# Patient Record
Sex: Female | Born: 1990 | Race: Black or African American | Hispanic: No | Marital: Single
Health system: Southern US, Community
[De-identification: ages and names within clinical notes are randomized; demographics above are authoritative.]

---
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
## 2012-06-13 ENCOUNTER — Emergency Department (HOSPITAL_COMMUNITY)
Admission: EM | Admit: 2012-06-13 | Discharge: 2012-06-13 | Disposition: A | Discharge status: Home / Self Care | Payer: Self-pay | Attending: Emergency Medicine | Admitting: Emergency Medicine

## 2012-06-13 ENCOUNTER — Encounter (HOSPITAL_COMMUNITY): Payer: Self-pay | Admitting: Emergency Medicine

## 2012-06-13 DIAGNOSIS — R109 Unspecified abdominal pain: Secondary | ICD-10-CM | POA: Insufficient documentation

## 2012-06-13 DIAGNOSIS — R1110 Vomiting, unspecified: Secondary | ICD-10-CM | POA: Insufficient documentation

## 2012-06-13 DIAGNOSIS — F17200 Nicotine dependence, unspecified, uncomplicated: Secondary | ICD-10-CM | POA: Insufficient documentation

## 2012-06-13 LAB — URINALYSIS, ROUTINE W REFLEX MICROSCOPIC
Leukocytes, UA: NEGATIVE
Protein, ur: NEGATIVE mg/dL
Urobilinogen, UA: 1 mg/dL (ref 0.0–1.0)

## 2012-06-13 LAB — CBC WITH DIFFERENTIAL/PLATELET
Basophils Absolute: 0 10*3/uL (ref 0.0–0.1)
Basophils Relative: 0 % (ref 0–1)
Eosinophils Absolute: 0.1 10*3/uL (ref 0.0–0.7)
Eosinophils Relative: 2 % (ref 0–5)
Lymphocytes Relative: 50 % — ABNORMAL HIGH (ref 12–46)
MCH: 31.7 pg (ref 26.0–34.0)
MCV: 95.4 fL (ref 78.0–100.0)
Platelets: 368 10*3/uL (ref 150–400)
RDW: 12.7 % (ref 11.5–15.5)
WBC: 7.1 10*3/uL (ref 4.0–10.5)

## 2012-06-13 LAB — POCT PREGNANCY, URINE: Preg Test, Ur: NEGATIVE

## 2012-06-13 LAB — BASIC METABOLIC PANEL
Calcium: 9.8 mg/dL (ref 8.4–10.5)
GFR calc non Af Amer: >90 mL/min (ref >90)
Sodium: 138 mEq/L (ref 135–145)

## 2012-06-13 MED ORDER — RANITIDINE HCL 150 MG PO CAPS: 150.0000 mg | ORAL_CAPSULE | Freq: Every day | ORAL | Status: DC

## 2012-06-13 MED ORDER — ONDANSETRON 4 MG PO TBDP
4.0000 mg | ORAL_TABLET | Freq: Once | ORAL | Status: AC
  Administered 2012-06-13: 4 mg via ORAL
  Filled 2012-06-13: qty 1

## 2012-06-13 MED ORDER — ONDANSETRON 4 MG PO TBDP: ORAL_TABLET | ORAL | Status: DC

## 2012-06-13 MED ORDER — OXYCODONE-ACETAMINOPHEN 5-325 MG PO TABS
1.0000 | ORAL_TABLET | Freq: Once | ORAL | Status: AC
  Administered 2012-06-13: 1 via ORAL
  Filled 2012-06-13: qty 1

## 2012-06-13 NOTE — ED Notes (Signed)
Pt c/o abd pain and vomiting intermittently x 1 month; pt sts some burning with urination but denies vaginal discharge; pt sts LMP was 8/15

## 2012-06-13 NOTE — ED Provider Notes (Signed)
History     CSN: [id number]  Arrival date & time 06/13/12  8657   First MD Initiated Contact with Patient 06/13/12 1158      Chief Complaint  Patient presents with  . Abdominal Pain  . Emesis    (Consider location/radiation/quality/duration/timing/severity/associated sxs/prior treatment) Patient is a 21 y.o. female presenting with abdominal pain. The history is provided by the patient.  Abdominal Pain The primary symptoms of the illness include abdominal pain, vomiting and diarrhea (frequent loos stools). The primary symptoms of the illness do not include fever, fatigue, shortness of breath, nausea, dysuria, vaginal discharge or vaginal bleeding. Episode onset: 1 mos. The onset of the illness was gradual. The problem has not changed since onset. The abdominal pain began more than 2 days ago. The pain came on gradually. Progression: intermittent. Pain Location: lower abdomen. The abdominal pain does not radiate. The severity of the abdominal pain is 1/10. Relieved by: motrin. Exacerbated by: nothing.  Symptoms associated with the illness do not include constipation, urgency, hematuria, frequency or back pain.    History reviewed. No pertinent past medical history.  History reviewed. No pertinent past surgical history.  History reviewed. No pertinent family history.  History  Substance Use Topics  . Smoking status: Current Every Day Smoker  . Smokeless tobacco: Not on file  . Alcohol Use: Yes     occ    OB History    Grav Para Term Preterm Abortions TAB SAB Ect Mult Living                  Review of Systems  Constitutional: Negative for fever and fatigue.  HENT: Negative for congestion, drooling and neck pain.   Eyes: Negative for pain.  Respiratory: Negative for cough and shortness of breath.   Cardiovascular: Negative for chest pain.  Gastrointestinal: Positive for vomiting, abdominal pain and diarrhea (frequent loos stools). Negative for nausea and constipation.    Genitourinary: Negative for dysuria, urgency, frequency, hematuria, flank pain, decreased urine volume, vaginal bleeding, vaginal discharge, difficulty urinating, genital sores, menstrual problem and pelvic pain.  Musculoskeletal: Negative for back pain and gait problem.  Skin: Negative for color change.  Neurological: Negative for dizziness and headaches.  Hematological: Negative for adenopathy.  Psychiatric/Behavioral: Negative for behavioral problems.  All other systems reviewed and are negative.    Allergies  Review of patient's allergies indicates no known allergies.  Home Medications   Current Outpatient Rx  Name Route Sig Dispense Refill  . IBUPROFEN 400 MG PO TABS Oral Take 800 mg by mouth every 6 (six) hours as needed.      Marland Kitchen ONDANSETRON 4 MG PO TBDP  4mg  ODT q4 hours prn nausea/vomit 10 tablet 0  . RANITIDINE HCL 150 MG PO CAPS Oral Take 1 capsule (150 mg total) by mouth daily. 30 capsule 0    BP 124/59  Pulse 68  Temp 98.2 F (36.8 C) (Oral)  Resp 18  SpO2 100%  Physical Exam  Nursing note and vitals reviewed. Constitutional: She is oriented to person, place, and time. She appears well-developed and well-nourished.  HENT:  Head: Normocephalic.  Mouth/Throat: No oropharyngeal exudate.  Eyes: Conjunctivae normal and EOM are normal. Pupils are equal, round, and reactive to light.  Neck: Normal range of motion. Neck supple.  Cardiovascular: Normal rate, regular rhythm, normal heart sounds and intact distal pulses.  Exam reveals no gallop and no friction rub.   No murmur heard. Pulmonary/Chest: Effort normal and breath sounds normal. No respiratory  distress. She has no wheezes.  Abdominal: Soft. Bowel sounds are normal. There is tenderness (mild suprapubic pain on exam). There is no rebound and no guarding.  Musculoskeletal: Normal range of motion. She exhibits no edema and no tenderness.  Neurological: She is alert and oriented to person, place, and time.  Skin:  Skin is warm and dry.  Psychiatric: She has a normal mood and affect. Her behavior is normal.    ED Course  Procedures (including critical care time)  Labs Reviewed  CBC WITH DIFFERENTIAL - Abnormal; Notable for the following:    Neutrophils Relative 42 (*)     Lymphocytes Relative 50 (*)     All other components within normal limits  URINALYSIS, ROUTINE W REFLEX MICROSCOPIC - Abnormal; Notable for the following:    APPearance HAZY (*)     All other components within normal limits  BASIC METABOLIC PANEL  POCT PREGNANCY, URINE   No results found.   1. Abdominal cramping   2. Vomiting       MDM  10:37 PM 21 y.o. female pw intermittent lower abd cramping and emesis x 1 mos. Pt notes mild lower abd pain now 1/10 on exam. Pt has regular period, LMP last month, no hx of STD's, not sexually active, no GU sx. Pt denies fever, pain relieved w/ motrin. Pt AFVSS here, w/ benign abd. Will give percocet for mild pain, zofran for nausea, screening labs.   Labs non-contrib. Pt continues to appear well. I have discussed the diagnosis/risks/treatment options with the patient and believe the pt to be eligible for discharge home to follow-up and establish with a pcp. We also discussed returning to the ED immediately if new or worsening sx occur. We discussed the sx which are most concerning (e.g., worsening pain, fever) that necessitate immediate return. Any new prescriptions provided to the patient are listed below.  New Prescriptions   ONDANSETRON (ZOFRAN ODT) 4 MG DISINTEGRATING TABLET    4mg  ODT q4 hours prn nausea/vomit   RANITIDINE (ZANTAC) 150 MG CAPSULE    Take 1 capsule (150 mg total) by mouth daily.    Clinical Impression 1. Abdominal cramping   2. Vomiting          Purvis Sheffield, MD 06/13/12 2237

## 2012-06-13 NOTE — ED Notes (Addendum)
Pt states she has been having abdominal pain, vomiting, and intermittent chest pain for 1 month. Pain is diffuse, nothing makes pain better or worse. Pt vomited this am at 0400. Pt states she has also had 2 loose bowel movements today. Pt currently denies any pain. Pt states "when I do have pain in my stomach, it is lower."

## 2012-06-14 NOTE — ED Provider Notes (Signed)
I saw and evaluated the patient, reviewed the resident's note and I agree with the findings and plan.   .Face to face Exam:  General:  Awake HEENT:  Atraumatic Resp:  Normal effort Abd:  Nondistended Neuro:No focal weakness Lymph: No adenopathy   Skarlette Lattner L Kelcee Bjorn, MD 06/14/12 0719 

## 2013-11-02 ENCOUNTER — Encounter (HOSPITAL_COMMUNITY): Payer: Self-pay | Admitting: Emergency Medicine

## 2013-11-02 ENCOUNTER — Emergency Department (HOSPITAL_COMMUNITY)
Admission: EM | Admit: 2013-11-02 | Discharge: 2013-11-02 | Disposition: A | Discharge status: Home / Self Care | Payer: PRIVATE HEALTH INSURANCE | Attending: Emergency Medicine | Admitting: Emergency Medicine

## 2013-11-02 DIAGNOSIS — F17200 Nicotine dependence, unspecified, uncomplicated: Secondary | ICD-10-CM | POA: Insufficient documentation

## 2013-11-02 DIAGNOSIS — J029 Acute pharyngitis, unspecified: Secondary | ICD-10-CM | POA: Insufficient documentation

## 2013-11-02 DIAGNOSIS — H60399 Other infective otitis externa, unspecified ear: Secondary | ICD-10-CM | POA: Insufficient documentation

## 2013-11-02 DIAGNOSIS — H6091 Unspecified otitis externa, right ear: Secondary | ICD-10-CM

## 2013-11-02 MED ORDER — ANTIPYRINE-BENZOCAINE 5.4-1.4 % OT SOLN
3.0000 [drp] | Freq: Once | OTIC | Status: AC
  Administered 2013-11-02: 4 [drp] via OTIC
  Filled 2013-11-02: qty 10

## 2013-11-02 MED ORDER — CIPROFLOXACIN-DEXAMETHASONE 0.3-0.1 % OT SUSP: 4.0000 [drp] | Freq: Two times a day (BID) | OTIC | Status: DC

## 2013-11-02 MED ORDER — ACETAMINOPHEN 325 MG PO TABS
650.0000 mg | ORAL_TABLET | Freq: Once | ORAL | Status: AC
  Administered 2013-11-02: 650 mg via ORAL
  Filled 2013-11-02: qty 2

## 2013-11-02 MED ORDER — ANTIPYRINE-BENZOCAINE 5.4-1.4 % OT SOLN: 3.0000 [drp] | OTIC | Status: DC | PRN

## 2013-11-02 NOTE — ED Provider Notes (Signed)
CSN: [id number]     Arrival date & time 11/02/13  0011 History   First MD Initiated Contact with Patient 11/02/13 0023     Chief Complaint  Patient presents with  . Otalgia  . Sore Throat     (Consider location/radiation/quality/duration/timing/severity/associated sxs/prior Treatment) HPI Comments: Patient is a 23 year old female who presents to the emergency department with her with mom complaining of right ear pain times one hour. Patient states over the past few days she has had a sore throat. Pain "severe". Pain worse with touching. She has not had any alleviating factors. Denies trauma. Denies fever, chills.  Patient is a 23 y.o. female presenting with ear pain and pharyngitis. The history is provided by the patient and a parent.  Otalgia Associated symptoms: sore throat   Sore Throat Associated symptoms include a sore throat.    History reviewed. No pertinent past medical history. History reviewed. No pertinent past surgical history. No family history on file. History  Substance Use Topics  . Smoking status: Current Every Day Smoker  . Smokeless tobacco: Not on file  . Alcohol Use: Yes     Comment: occ   OB History   Grav Para Term Preterm Abortions TAB SAB Ect Mult Living                 Review of Systems  HENT: Positive for ear pain and sore throat.   All other systems reviewed and are negative.      Allergies  Review of patient's allergies indicates no known allergies.  Home Medications   Current Outpatient Rx  Name  Route  Sig  Dispense  Refill  . antipyrine-benzocaine (AURALGAN) otic solution   Right Ear   Place 3-4 drops into the right ear every 2 (two) hours as needed for ear pain.   10 mL   0   . ciprofloxacin-dexamethasone (CIPRODEX) otic suspension   Right Ear   Place 4 drops into the right ear 2 (two) times daily.   7.5 mL   0    BP 133/82  Pulse 82  Temp(Src) 98.1 F (36.7 C) (Oral)  Resp 18  Ht 5\' 5"  (1.651 m)  SpO2 98%  LMP  10/19/2013 Physical Exam  Nursing note and vitals reviewed. Constitutional: She is oriented to person, place, and time. She appears well-developed and well-nourished. No distress.  Tearful.  HENT:  Head: Normocephalic and atraumatic.  Right Ear: Tympanic membrane normal.  Left Ear: Tympanic membrane normal.  Mouth/Throat: Oropharynx is clear and moist.  Right ear Canal inflamed, erythematous, tender.  Eyes: Conjunctivae are normal.  Neck: Normal range of motion. Neck supple.  Cardiovascular: Normal rate, regular rhythm and normal heart sounds.   Pulmonary/Chest: Effort normal and breath sounds normal.  Musculoskeletal: Normal range of motion. She exhibits no edema.  Neurological: She is alert and oriented to person, place, and time.  Skin: Skin is warm and dry. She is not diaphoretic.  Psychiatric: She has a normal mood and affect. Her behavior is normal.    ED Course  Procedures (including critical care time) Labs Review Labs Reviewed - No data to display Imaging Review No results found.  EKG Interpretation   None       MDM   Final diagnoses:  Otitis externa of right ear    Ciprodex, auralgan. Well appearing, afebrile, VSS. Stable for d/c. Return precautions given. Patient states understanding of treatment care plan and is agreeable.     Trevor MaceRobyn M Albert, PA-C 11/02/13 [id number]

## 2013-11-02 NOTE — Discharge Instructions (Signed)
Use eardrops as directed. Take Tylenol every 6 hours as needed for pain.  Otitis Externa Otitis externa is a bacterial or fungal infection of the outer ear canal. This is the area from the eardrum to the outside of the ear. Otitis externa is sometimes called "swimmer's ear." CAUSES  Possible causes of infection include:  Swimming in dirty water.  Moisture remaining in the ear after swimming or bathing.  Mild injury (trauma) to the ear.  Objects stuck in the ear (foreign body).  Cuts or scrapes (abrasions) on the outside of the ear. SYMPTOMS  The first symptom of infection is often itching in the ear canal. Later signs and symptoms may include swelling and redness of the ear canal, ear pain, and yellowish-white fluid (pus) coming from the ear. The ear pain may be worse when pulling on the earlobe. DIAGNOSIS  Your caregiver will perform a physical exam. A sample of fluid may be taken from the ear and examined for bacteria or fungi. TREATMENT  Antibiotic ear drops are often given for 10 to 14 days. Treatment may also include pain medicine or corticosteroids to reduce itching and swelling. PREVENTION   Keep your ear dry. Use the corner of a towel to absorb water out of the ear canal after swimming or bathing.  Avoid scratching or putting objects inside your ear. This can damage the ear canal or remove the protective wax that lines the canal. This makes it easier for bacteria and fungi to grow.  Avoid swimming in lakes, polluted water, or poorly chlorinated pools.  You may use ear drops made of rubbing alcohol and vinegar after swimming. Combine equal parts of white vinegar and alcohol in a bottle. Put 3 or 4 drops into each ear after swimming. HOME CARE INSTRUCTIONS   Apply antibiotic ear drops to the ear canal as prescribed by your caregiver.  Only take over-the-counter or prescription medicines for pain, discomfort, or fever as directed by your caregiver.  If you have diabetes,  follow any additional treatment instructions from your caregiver.  Keep all follow-up appointments as directed by your caregiver. SEEK MEDICAL CARE IF:   You have a fever.  Your ear is still red, swollen, painful, or draining pus after 3 days.  Your redness, swelling, or pain gets worse.  You have a severe headache.  You have redness, swelling, pain, or tenderness in the area behind your ear. MAKE SURE YOU:   Understand these instructions.  Will watch your condition.  Will get help right away if you are not doing well or get worse. Document Released: 09/06/2005 Document Revised: 11/29/2011 Document Reviewed: 09/23/2011 [facility] CenterExit[facility] Patient Information 2014 [place], [place].

## 2013-11-02 NOTE — ED Notes (Signed)
Pt states past couple days has had sore throat, then tonight about 20 minutes ago had sudden severe R ear pain, denies trauma to ear. Pt crying in triage.

## 2013-11-03 NOTE — ED Provider Notes (Signed)
Medical screening examination/treatment/procedure(s) were performed by non-physician practitioner and as supervising physician I was immediately available for consultation/collaboration.  EKG Interpretation   None        Heleena Miceli, MD 11/03/13 0529 

## 2013-12-26 ENCOUNTER — Encounter (HOSPITAL_COMMUNITY): Payer: Self-pay | Admitting: Emergency Medicine

## 2013-12-26 ENCOUNTER — Emergency Department (HOSPITAL_COMMUNITY): Payer: PRIVATE HEALTH INSURANCE

## 2013-12-26 ENCOUNTER — Emergency Department (HOSPITAL_COMMUNITY)
Admission: EM | Admit: 2013-12-26 | Discharge: 2013-12-26 | Disposition: A | Discharge status: Home / Self Care | Payer: PRIVATE HEALTH INSURANCE | Attending: Emergency Medicine | Admitting: Emergency Medicine

## 2013-12-26 DIAGNOSIS — R6883 Chills (without fever): Secondary | ICD-10-CM | POA: Insufficient documentation

## 2013-12-26 DIAGNOSIS — R350 Frequency of micturition: Secondary | ICD-10-CM | POA: Insufficient documentation

## 2013-12-26 DIAGNOSIS — R079 Chest pain, unspecified: Secondary | ICD-10-CM | POA: Insufficient documentation

## 2013-12-26 DIAGNOSIS — M549 Dorsalgia, unspecified: Secondary | ICD-10-CM | POA: Insufficient documentation

## 2013-12-26 DIAGNOSIS — IMO0001 Reserved for inherently not codable concepts without codable children: Secondary | ICD-10-CM | POA: Insufficient documentation

## 2013-12-26 DIAGNOSIS — R1110 Vomiting, unspecified: Secondary | ICD-10-CM | POA: Insufficient documentation

## 2013-12-26 DIAGNOSIS — R197 Diarrhea, unspecified: Secondary | ICD-10-CM | POA: Insufficient documentation

## 2013-12-26 DIAGNOSIS — F17200 Nicotine dependence, unspecified, uncomplicated: Secondary | ICD-10-CM | POA: Insufficient documentation

## 2013-12-26 LAB — URINALYSIS, ROUTINE W REFLEX MICROSCOPIC
Bilirubin Urine: NEGATIVE
GLUCOSE, UA: NEGATIVE mg/dL
Hgb urine dipstick: NEGATIVE
Ketones, ur: 40 mg/dL — AB
Leukocytes, UA: NEGATIVE
NITRITE: NEGATIVE
PROTEIN: NEGATIVE mg/dL
Specific Gravity, Urine: 1.029 (ref 1.005–1.030)
UROBILINOGEN UA: 0.2 mg/dL (ref 0.0–1.0)
pH: 6 (ref 5.0–8.0)

## 2013-12-26 LAB — POC URINE PREG, ED: PREG TEST UR: NEGATIVE

## 2013-12-26 LAB — CBG MONITORING, ED: Glucose-Capillary: 79 mg/dL (ref 70–99)

## 2013-12-26 MED ORDER — ONDANSETRON 4 MG PO TBDP
4.0000 mg | ORAL_TABLET | Freq: Once | ORAL | Status: AC
  Administered 2013-12-26: 4 mg via ORAL
  Filled 2013-12-26: qty 1

## 2013-12-26 NOTE — ED Provider Notes (Signed)
CSN: [id number]     Arrival date & time 12/26/13  1131 History   First MD Initiated Contact with Patient 12/26/13 1143     Chief Complaint  Patient presents with  . Back Pain  . Emesis  . Generalized Body Aches     (Consider location/radiation/quality/duration/timing/severity/associated sxs/prior Treatment) Patient is a 23 y.o. female presenting with general illness.  Illness Quality:  Malaise, chest pain, back pain, loose stool, urinary frequency, sweats and chills Severity:  Moderate Onset quality:  Gradual Duration:  3 days Timing:  Constant Progression:  Unchanged Chronicity:  New Context:  Today, had an episode of emesis, which prompted her ED visit when coworkers told her to come get checked out.  Associated symptoms: chest pain   Associated symptoms: no fever and no shortness of breath   Chest pain:    Quality:  Stabbing (upper central chest)   Severity:  Moderate   Onset quality:  Gradual   Duration:  3 days   Timing:  Constant   History reviewed. No pertinent past medical history. History reviewed. No pertinent past surgical history. History reviewed. No pertinent family history. History  Substance Use Topics  . Smoking status: Current Every Day Smoker  . Smokeless tobacco: Not on file  . Alcohol Use: Yes     Comment: occ   OB History   Grav Para Term Preterm Abortions TAB SAB Ect Mult Living                 Review of Systems  Constitutional: Negative for fever.  Respiratory: Negative for shortness of breath.   Cardiovascular: Positive for chest pain.  All other systems reviewed and are negative.     Allergies  Review of patient's allergies indicates no known allergies.  Home Medications   Current Outpatient Rx  Name  Route  Sig  Dispense  Refill  . antipyrine-benzocaine (AURALGAN) otic solution   Right Ear   Place 3-4 drops into the right ear every 2 (two) hours as needed for ear pain.   10 mL   0   . ciprofloxacin-dexamethasone (CIPRODEX)  otic suspension   Right Ear   Place 4 drops into the right ear 2 (two) times daily.   7.5 mL   0    BP 123/73  Pulse 96  Temp(Src) 99 F (37.2 C) (Oral)  Resp 20  SpO2 99%  LMP 12/15/2013 Physical Exam  Nursing note and vitals reviewed. Constitutional: She is oriented to person, place, and time. She appears well-developed and well-nourished. No distress.  HENT:  Head: Normocephalic and atraumatic.  Mouth/Throat: Oropharynx is clear and moist.  Eyes: Conjunctivae are normal. Pupils are equal, round, and reactive to light. No scleral icterus.  Neck: Neck supple.  Cardiovascular: Normal rate, regular rhythm, normal heart sounds and intact distal pulses.   No murmur heard. Pulmonary/Chest: Effort normal and breath sounds normal. No stridor. No respiratory distress. She has no rales. She exhibits tenderness (upper left chest).  Abdominal: Soft. Bowel sounds are normal. She exhibits no distension. There is no tenderness.  Musculoskeletal: Normal range of motion.       Back:  Neurological: She is alert and oriented to person, place, and time.  Skin: Skin is warm and dry. No rash noted.  Psychiatric: She has a normal mood and affect. Her behavior is normal.    ED Course  Procedures (including critical care time) Labs Review Labs Reviewed  URINALYSIS, ROUTINE W REFLEX MICROSCOPIC - Abnormal; Notable for the following:  Ketones, ur 40 (*)    All other components within normal limits  POC URINE PREG, ED  CBG MONITORING, ED   Imaging Review Dg Chest 2 View  12/26/2013   CLINICAL DATA:  chest pain  EXAM: CHEST  2 VIEW  COMPARISON:  DG CHEST 2 VIEW dated 01/05/2011  FINDINGS: The heart size and mediastinal contours are within normal limits. Both lungs are clear. The visualized skeletal structures are unremarkable.  IMPRESSION: No active cardiopulmonary disease.   Electronically Signed   By: Salome Holmes M.D.   On: 12/26/2013 12:57  All radiology studies independently viewed by me.       EKG Interpretation   Date/Time:  Wednesday December 26 2013 12:37:32 EDT Ventricular Rate:  64 PR Interval:  135 QRS Duration: 78 QT Interval:  357 QTC Calculation: 368 R Axis:   84 Text Interpretation:  Sinus arrhythmia Probable left atrial enlargement No  significant change was found Confirmed by [facility]  MD, TREY ([id number]) on  12/26/2013 1:44:55 PM      MDM   Final diagnoses:  Chest pain  Back pain    23 year old female with multiple vague and nonspecific complaints including chest pain, back pain, urinary frequency, malaise, hot sweats followed quickly by cold chills. No fevers, mild cough. EKG unchanged. Symptoms not consistent with PE and patient is PERC negative. Dissection unlikely given history, chest x-ray, equal pulses bilaterally, blood pressure. With her other symptoms, she likely has a viral syndrome. Well-appearing and appears stable for discharge. Recommended supportive treatment.    Candyce Churn III, MD 12/26/13 [phone]

## 2013-12-26 NOTE — ED Notes (Signed)
MD at bedside to update patient

## 2013-12-26 NOTE — ED Notes (Signed)
CBG 79 

## 2013-12-26 NOTE — ED Notes (Signed)
Per pt sts for the past few days she has been having generalized body aches, vomiting, diarrhea, coughing, fever, chills.

## 2013-12-26 NOTE — Discharge Instructions (Signed)
Chest Pain (Nonspecific) °It is often hard to give a specific diagnosis for the cause of chest pain. There is always a chance that your pain could be related to something serious, such as a heart attack or a blood clot in the lungs. You need to follow up with your caregiver for further evaluation. °CAUSES  °· Heartburn. °· Pneumonia or bronchitis. °· Anxiety or stress. °· Inflammation around your heart (pericarditis) or lung (pleuritis or pleurisy). °· A blood clot in the lung. °· A collapsed lung (pneumothorax). It can develop suddenly on its own (spontaneous pneumothorax) or from injury (trauma) to the chest. °· Shingles infection (herpes zoster virus). °The chest wall is composed of bones, muscles, and cartilage. Any of these can be the source of the pain. °· The bones can be bruised by injury. °· The muscles or cartilage can be strained by coughing or overwork. °· The cartilage can be affected by inflammation and become sore (costochondritis). °DIAGNOSIS  °Lab tests or other studies, such as X-rays, electrocardiography, stress testing, or cardiac imaging, may be needed to find the cause of your pain.  °TREATMENT  °· Treatment depends on what may be causing your chest pain. Treatment may include: °· Acid blockers for heartburn. °· Anti-inflammatory medicine. °· Pain medicine for inflammatory conditions. °· Antibiotics if an infection is present. °· You may be advised to change lifestyle habits. This includes stopping smoking and avoiding alcohol, caffeine, and chocolate. °· You may be advised to keep your head raised (elevated) when sleeping. This reduces the chance of acid going backward from your stomach into your esophagus. °· Most of the time, nonspecific chest pain will improve within 2 to 3 days with rest and mild pain medicine. °HOME CARE INSTRUCTIONS  °· If antibiotics were prescribed, take your antibiotics as directed. Finish them even if you start to feel better. °· For the next few days, avoid physical  activities that bring on chest pain. Continue physical activities as directed. °· Do not smoke. °· Avoid drinking alcohol. °· Only take over-the-counter or prescription medicine for pain, discomfort, or fever as directed by your caregiver. °· Follow your caregiver's suggestions for further testing if your chest pain does not go away. °· Keep any follow-up appointments you made. If you do not go to an appointment, you could develop lasting (chronic) problems with pain. If there is any problem keeping an appointment, you must call to reschedule. °SEEK MEDICAL CARE IF:  °· You think you are having problems from the medicine you are taking. Read your medicine instructions carefully. °· Your chest pain does not go away, even after treatment. °· You develop a rash with blisters on your chest. °SEEK IMMEDIATE MEDICAL CARE IF:  °· You have increased chest pain or pain that spreads to your arm, neck, jaw, back, or abdomen. °· You develop shortness of breath, an increasing cough, or you are coughing up blood. °· You have severe back or abdominal pain, feel nauseous, or vomit. °· You develop severe weakness, fainting, or chills. °· You have a fever. °THIS IS AN EMERGENCY. Do not wait to see if the pain will go away. Get medical help at once. Call your local emergency services (911 in [place] Do not drive yourself to the hospital. °MAKE SURE YOU:  °· Understand these instructions. °· Will watch your condition. °· Will get help right away if you are not doing well or get worse. °Document Released: 06/16/2005 Document Revised: 11/29/2011 Document Reviewed: 04/11/2008 °ExitCare® Patient Information ©2014 ExitCare,   LLC. ° °Back Pain, Adult °Low back pain is very common. About 1 in 5 people have back pain. The cause of low back pain is rarely dangerous. The pain often gets better over time. About half of people with a sudden onset of back pain feel better in just 2 weeks. About 8 in 10 people feel better by 6 weeks.  °CAUSES °Some  common causes of back pain include: °· Strain of the muscles or ligaments supporting the spine. °· Wear and tear (degeneration) of the spinal discs. °· Arthritis. °· Direct injury to the back. °DIAGNOSIS °Most of the time, the direct cause of low back pain is not known. However, back pain can be treated effectively even when the exact cause of the pain is unknown. Answering your caregiver's questions about your overall health and symptoms is one of the most accurate ways to make sure the cause of your pain is not dangerous. If your caregiver needs more information, he or she may order lab work or imaging tests (X-rays or MRIs). However, even if imaging tests show changes in your back, this usually does not require surgery. °HOME CARE INSTRUCTIONS °For many people, back pain returns. Since low back pain is rarely dangerous, it is often a condition that people can learn to manage on their own.  °· Remain active. It is stressful on the back to sit or stand in one place. Do not sit, drive, or stand in one place for more than 30 minutes at a time. Take short walks on level surfaces as soon as pain allows. Try to increase the length of time you walk each day. °· Do not stay in bed. Resting more than 1 or 2 days can delay your recovery. °· Do not avoid exercise or work. Your body is made to move. It is not dangerous to be active, even though your back may hurt. Your back will likely heal faster if you return to being active before your pain is gone. °· Pay attention to your body when you  bend and lift. Many people have less discomfort when lifting if they bend their knees, keep the load close to their bodies, and avoid twisting. Often, the most comfortable positions are those that put less stress on your recovering back. °· Find a comfortable position to sleep. Use a firm mattress and lie on your side with your knees slightly bent. If you lie on your back, put a pillow under your knees. °· Only take over-the-counter or  prescription medicines as directed by your caregiver. Over-the-counter medicines to reduce pain and inflammation are often the most helpful. Your caregiver may prescribe muscle relaxant drugs. These medicines help dull your pain so you can more quickly return to your normal activities and healthy exercise. °· Put ice on the injured area. °· Put ice in a plastic bag. °· Place a towel between your skin and the bag. °· Leave the ice on for 15-20 minutes, 03-04 times a day for the first 2 to 3 days. After that, ice and heat may be alternated to reduce pain and spasms. °· Ask your caregiver about trying back exercises and gentle massage. This may be of some benefit. °· Avoid feeling anxious or stressed. Stress increases muscle tension and can worsen back pain. It is important to recognize when you are anxious or stressed and learn ways to manage it. Exercise is a great option. °SEEK MEDICAL CARE IF: °· You have pain that is not relieved with rest or medicine. °· You have pain that does not improve in 1 week. °· You have new symptoms. °· You are generally not   feeling well. SEEK IMMEDIATE MEDICAL CARE IF:   You have pain that radiates from your back into your legs.  You develop new bowel or bladder control problems.  You have unusual weakness or numbness in your arms or legs.  You develop nausea or vomiting.  You develop abdominal pain.  You feel faint. Document Released: 09/06/2005 Document Revised: 03/07/2012 Document Reviewed: 01/25/2011 [facility] Patient Information 2014 [place]Care, [place].   Emergency Department Resource Guide 1) Find a Doctor and Pay Out of Pocket Although you won't have to find out who is covered by your insurance plan, it is a good idea to ask around and get recommendations. You will then need to call the office and see if the doctor you have chosen will accept you as a new patient and what types of options they offer for patients who are self-pay. Some doctors offer discounts or  will set up payment plans for their patients who do not have insurance, but you will need to ask so you aren't surprised when you get to your appointment.  2) Contact Your Local Health Department Not all health departments have doctors that can see patients for sick visits, but many do, so it is worth a call to see if yours does. If you don't know where your local health department is, you can check in your phone book. The CDC also has a tool to help you locate your state's health department, and many state websites also have listings of all of their local health departments.  3) Find a Walk-in Clinic If your illness is not likely to be very severe or complicated, you may want to try a walk in clinic. These are popping up all over the country in pharmacies, drugstores, and shopping centers. They're usually staffed by nurse practitioners or physician assistants that have been trained to treat common illnesses and complaints. They're usually fairly quick and inexpensive. However, if you have serious medical issues or chronic medical problems, these are probably not your best option.  No Primary Care Doctor: - Call Health Connect at  [phone] - they can help you locate a primary care doctor that  accepts your insurance, provides certain services, etc. - Physician Referral Service- [phone]  Chronic Pain Problems: Organization         Address  Phone   Notes  [facility]  [phone] Patients need to be referred by their primary care doctor.   Medication Assistance: Organization         Address  Phone   Notes  [facility] [address] [place], [place] [address] [phone]) [phone] --Must be a resident of [facility] -- Must have NO insurance coverage whatsoever (no Medicaid/ Medicare, etc.) -- The pt. MUST have a primary care doctor that directs their care regularly and follows them in the community   MedAssist  [phone])  [phone]   [employer]  [phone]    Agencies that provide inexpensive medical care: Organization         Address  Phone   Notes  [facility]  [phone]   [facility]    [phone]) [phone]   [facility] [address] [place], [place] [address] [phone]) [phone]   [facility] [address], [place] [phone]) [phone]   [facility]    [phone]   [facility]    [phone]   [facility]  [address], [place] Phone:  [phone], Fax:  [phone] Hours of Operation:  9 am - 6 pm, M-F.  Also accepts Medicaid/Medicare and self-pay.  [facility]  [address], [place] Phone: [phone], Fax: [phone]. Hours of Operation:  8:30 am - 5:30 pm, M-F.  Also accepts Medicaid and self-pay.  [facility] [place] [address], [place] Phone: [phone]   [facility] [address] Natasha Bence [place], [place] [phone], Ext. [phone] Mondays & Thursdays: 7-9 AM.  First 15 patients are seen on a first come, first serve basis.    Medicaid-accepting [facility] Providers:  Organization         Address  Phone   Notes  [facility] [address],  [phone] Also accepts self-pay patients.  [facility] [address] Laurell Josephs [place], [place]  [phone]   [facility] [address], [place] [phone]   [facility] [address], [place] [phone]   Renaye Rakers [address], [place]   [phone] Only accepts [place] Access [place] patients after they have their name applied to their card.   Self-Pay (no insurance) in [facility]:  Organization         Address  Phone   Notes  Sickle Cell Patients, [facility] [address]  [place], [place] [phone]   [facility] [address] [place], [place] [phone]   [facility] [place]  [address], [place] [phone]   [facility]/Dr. Osei-Bonsu  [address] [place] or [address], [place] [phone] Phone number for both [place] and [place] locations is the same.  [facility] [address] [place] [phone]   [facility] [address], [place] or [address] [phone]   [facility] [address] [place] [phone], phone; [phone], fax Sees patients 1st and 3rd Saturday of every month.  Must not qualify for public or private insurance (i.e. Medicaid, Medicare, [place] Health Choice, Veterans' Benefits)  Household income should be no more than 200% of the poverty level The clinic cannot treat you if you are pregnant or think you are pregnant  Sexually transmitted diseases are not treated at the clinic.    Dental Care: Organization         Address  Phone  Notes  [facility] [address] [place], [place] [phone] Accepts children up to age 59 who are enrolled in [place] or [place] Health Choice; pregnant women with a Medicaid card; and children who have applied for Medicaid or [place] Health Choice, but were declined, whose parents can pay a reduced fee at time of service.  [facility]  [address], [place] [phone] Accepts children up to age 33 who are enrolled in [place] or [place] Health Choice; pregnant women with a Medicaid card; and children who have applied for Medicaid or [place] Health Choice, but were declined, whose parents can pay a reduced fee at time of service.  [facility]  [address] [place],  [place] [phone]  [phone] Patients are seen by appointment only. Walk-ins are not accepted. [facility] will see patients 21 years of age and older. Monday - Tuesday (8am-5pm) Most Wednesdays (8:30-5pm) $30 per visit, cash only  [facility]  [address], [place] [facility] [phone] Patients are seen by appointment only. Walk-ins are not accepted. [facility] will see patients 918 years of age and older. One Wednesday Evening (Monthly: Volunteer Based).  $30 per visit, cash only  [employer] [facility]  [phone] for adults; Children under age 584, call Graduate Pediatric Dentistry at [phone]) [phone]. Children aged 434-14, please call [phone] to request a pediatric application.  Dental services are provided in all areas of dental care including fillings, crowns and bridges, complete and partial dentures, implants, gum treatment, root canals, and extractions. Preventive care is also provided. Treatment is provided to both adults and children. Patients are selected via a lottery and there is often a waiting list.   [facility] [address], [place]  [phone]) [phone] [email or web address]   [facility] [address], [place], [place] [phone], Ext. [phone] Second and Fourth Thursday of each month, opens at 6:30 AM; Clinic ends at 9 AM.  Patients are seen on a first-come first-served basis, and a limited number are seen during each clinic.   [facility]  [address], [place], [place] [phone]   Eligibility Requirements You must have lived in [place], [place], or [place] for at least the last three months.   You cannot be eligible for state or federal sponsored [place] Cityhealthcare insurance, including CIGNAVeterans Administration, [place]icaid, or [employer] EntertainmentMedicare.   You generally cannot be eligible for healthcare insurance through your employer.    How to apply: Eligibility screenings are held every Tuesday and Wednesday afternoon  from 1:00 pm until 4:00 pm. You do not need an appointment for the interview!  [facility] [address], [place], [place] [phone]   [facility]  [phone]   [facility]  [phone]   [facility]  [phone]    Behavioral Health Resources in the Community: Intensive Outpatient Programs Organization         Address  Phone  Notes  [facility] [address], [place], [place] [phone]   [facility] [address], [place], [place] [phone]   [facility] [address], [place], [place]  [phone]   [facility] [address],  [place], [place] [phone] or [phone]   Substance Abuse Resources Organization         Address  Phone  Notes  [facility]  [phone]   [facility]  [phone]   [facility]  [phone]   [facility]  [phone]   Residential & Outpatient Substance Abuse Program  [phone]   Psychological Services Organization         Address  Phone  Notes  [facility]  [phone]- [phone]   [facility]  [phone]- [phone]   [facility] [address], [place] [phone] or [phone]    Mobile Crisis Teams Organization         Address  Phone  Notes  [facility], Mobile Crisis Care Unit  [phone]   [facility]  [address] [place], [place] [phone]   Doristine LocksSharon DeEsch [phone]  [address] [place] [phone]    Self-Help/Support Groups Organization         Address  Phone             Notes  [facility] [place] - variety of support groups  [phone]- [phone] Call for more information  Narcotics Anonymous (NA), [facility] [address], [employer] [place]  2 meetings at this location   Nutritional therapist         Address  Phone  Notes  [facility] [address],    [place]  [phone]   [facility]  [address], [place] [address], [place], [place] [phone]   [facility] [address] [place], [place] [phone] Admissions: 8am-3pm M-F  [facility] [address],    [place], [place] [phone]   [facility] [address] [place], [place], [place] [phone]   [facility] [address],  [place], [place] [phone]   [facility] - Intensive Outpatient [address] Laurell Josephs [address], [place], [place] [phone]   [facility] ([facility] [address]  [place], [place] [phone] or [phone]   [facility] ([facility]) [address] [place], [place] [phone] Accepts Medicaid  [facility] [address],  [place] [phone] Substance Abuse/Addiction Treatment   [facility] Organization         Address  Phone  Notes  [facility]  [phone]   Angie Fava, PhD [address] Ervin Knack [place], [place]   [phone] or [phone]   [facility]   [address] [place], [place] [phone]   [facility] [address] [place], [place] [phone] Insurance/Medicaid/sponsorship through [facility] [address]                                    [place], [place] [phone] Therapy/tele-psych/case  [facility] [address] Street[place], [place] [phone]    Dr. Lolly Mustache  [phone]   [facility]  [employer] [facility] Dept. 1) [address] [place] 2) [address], [place] 3)  [address], [place] [phone]  [phone]   [facility] Child Abuse Hotline 8072679301 or [phone] (After Hours)

## 2014-04-14 ENCOUNTER — Encounter (HOSPITAL_COMMUNITY): Payer: Self-pay | Admitting: Emergency Medicine

## 2014-04-14 ENCOUNTER — Emergency Department (HOSPITAL_COMMUNITY)
Admission: EM | Admit: 2014-04-14 | Discharge: 2014-04-14 | Disposition: A | Discharge status: Home / Self Care | Payer: PRIVATE HEALTH INSURANCE | Attending: Emergency Medicine | Admitting: Emergency Medicine

## 2014-04-14 DIAGNOSIS — F17200 Nicotine dependence, unspecified, uncomplicated: Secondary | ICD-10-CM | POA: Insufficient documentation

## 2014-04-14 DIAGNOSIS — R21 Rash and other nonspecific skin eruption: Secondary | ICD-10-CM | POA: Insufficient documentation

## 2014-04-14 DIAGNOSIS — B029 Zoster without complications: Secondary | ICD-10-CM | POA: Insufficient documentation

## 2014-04-14 MED ORDER — ACYCLOVIR 400 MG PO TABS: 400.0000 mg | ORAL_TABLET | Freq: Four times a day (QID) | ORAL | Status: AC

## 2014-04-14 MED ORDER — OXYCODONE-ACETAMINOPHEN 5-325 MG PO TABS
2.0000 | ORAL_TABLET | Freq: Once | ORAL | Status: AC
  Administered 2014-04-14: 2 via ORAL
  Filled 2014-04-14: qty 2

## 2014-04-14 MED ORDER — OXYCODONE-ACETAMINOPHEN 5-325 MG PO TABS: 1.0000 | ORAL_TABLET | ORAL | Status: AC | PRN

## 2014-04-14 NOTE — ED Provider Notes (Signed)
CSN: [id number]     Arrival date & time 04/14/14  1836 History  This chart was scribed for non-physician provider Arthor Captain, PA-C, working with Dagmar Hait, MD by Phillis Haggis, ED Scribe. This patient was seen in room TR08C/TR08C and patient care was started at 7:10 PM.    Chief Complaint  Patient presents with  . Rash   Patient is a 23 y.o. female presenting with rash. The history is provided by the patient. No language interpreter was used.  Rash Location:  Leg and torso Torso rash location:  R flank and lower back Leg rash location:  R upper leg and R lower leg Quality: burning   Severity:  Moderate Duration:  2 days Timing:  Constant Progression:  Worsening Chronicity:  New Ineffective treatments:  Anti-itch cream Associated symptoms: joint pain   Associated symptoms: no fever    HPI Comments: Katelyn Boyer is a 23 y.o. female who presents to the Emergency Department complaining of a right leg, buttocks, and back rash onset 2 days ago. She states that she went to ride go-karts on Friday and later had leg spasms, then saw white itchy areas on her leg, buttocks and back, the next day. She states that the areas on her leg are not as itchy as they are painful and burning. She states that she has been using calamine lotion over the affected areas to no relief. She denies any other family members with similar symptoms. She denies any other medical problems or added stress. She states that she had chicken pox a long time ago. 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   History reviewed. No pertinent past medical history. History reviewed. No pertinent past surgical history. History reviewed. No pertinent family history. History  Substance Use Topics  . Smoking status: Current Every Day Smoker  . Smokeless tobacco: Not on file  . Alcohol Use: Yes     Comment: occ   OB History   Grav Para Term Preterm Abortions TAB SAB Ect Mult  Living                 Review of Systems  Constitutional: Negative for fever and chills.  Musculoskeletal: Positive for arthralgias.  Skin: Positive for rash.   Allergies  Review of patient's allergies indicates no known allergies.  Home Medications   Prior to Admission medications   Medication Sig Start Date End Date Taking? Authorizing Provider  diphenhydrAMINE (BENADRYL) 12.5 MG/5ML liquid Take 25 mg by mouth at bedtime as needed for allergies or sleep.    Historical Provider, MD   BP 124/72  Pulse 94  Temp(Src) 98.3 F (36.8 C) (Oral)  Resp 15  Ht 5\' 4"  (1.626 m)  Wt 190 lb (86.183 kg)  BMI 32.60 kg/m2  SpO2 99% Physical Exam  Nursing note and vitals reviewed. Constitutional: She is oriented to person, place, and time. She appears well-developed and well-nourished.  HENT:  Head: Normocephalic and atraumatic.  Eyes: EOM are normal.  Neck: Normal range of motion. Neck supple.  Cardiovascular: Normal rate.   Pulmonary/Chest: Effort normal.  Musculoskeletal: Normal range of motion.  Neurological: She is alert and oriented to person, place, and time.  Skin: Skin is warm and dry. Rash noted.  Multiple vesicles tender to palpation in S1 and S2 dermatones of right leg.   Psychiatric: She has a normal mood and affect. Her behavior is normal.    ED Course  Procedures (including critical  care time) DIAGNOSTIC STUDIES: Oxygen Saturation is 99% on room air, normal by my interpretation.    COORDINATION OF CARE: 7:14 PM-Discussed treatment plan which includes acyclovir with pt at bedside and pt agreed to plan.   Labs Review Labs Reviewed - No data to display  Imaging Review No results found.   EKG Interpretation None      MDM   Final diagnoses:  Shingles   Patient with Apparent shingles outbreak of the R post thigh. No known underlying immunocompromise. Patient will be discharged with acyclovir and pain meds.  Discussed contact precautions and return  precautions.  I personally performed the services described in this documentation, which was scribed in my presence. The recorded information has been reviewed and is accurate.    Arthor CaptainAbigail Alexios Keown, PA-C 04/14/14 1955

## 2014-04-14 NOTE — Discharge Instructions (Signed)

## 2014-04-14 NOTE — ED Notes (Signed)
She states she was in poison ivy on Friday and shes had itchy painful rash to back of R leg/butt/back since. She applied otc cream with no relief

## 2014-04-15 NOTE — ED Provider Notes (Signed)
Medical screening examination/treatment/procedure(s) were performed by non-physician practitioner and as supervising physician I was immediately available for consultation/collaboration.   EKG Interpretation None        William Mistie Adney, MD 04/15/14 0025 

## 2015-02-13 ENCOUNTER — Encounter (HOSPITAL_COMMUNITY): Payer: Self-pay | Admitting: Emergency Medicine

## 2015-02-13 ENCOUNTER — Other Ambulatory Visit (HOSPITAL_COMMUNITY): Payer: Self-pay

## 2015-02-13 ENCOUNTER — Emergency Department (HOSPITAL_COMMUNITY)
Admission: EM | Admit: 2015-02-13 | Discharge: 2015-02-13 | Disposition: A | Discharge status: Home / Self Care | Payer: Self-pay | Attending: Emergency Medicine | Admitting: Emergency Medicine

## 2015-02-13 ENCOUNTER — Emergency Department (HOSPITAL_COMMUNITY): Payer: Self-pay

## 2015-02-13 DIAGNOSIS — Y9289 Other specified places as the place of occurrence of the external cause: Secondary | ICD-10-CM | POA: Insufficient documentation

## 2015-02-13 DIAGNOSIS — S83105D Unspecified dislocation of left knee, subsequent encounter: Secondary | ICD-10-CM | POA: Insufficient documentation

## 2015-02-13 DIAGNOSIS — Z720 Tobacco use: Secondary | ICD-10-CM | POA: Insufficient documentation

## 2015-02-13 DIAGNOSIS — S86912A Strain of unspecified muscle(s) and tendon(s) at lower leg level, left leg, initial encounter: Secondary | ICD-10-CM | POA: Insufficient documentation

## 2015-02-13 DIAGNOSIS — S8392XA Sprain of unspecified site of left knee, initial encounter: Secondary | ICD-10-CM | POA: Insufficient documentation

## 2015-02-13 DIAGNOSIS — X58XXXA Exposure to other specified factors, initial encounter: Secondary | ICD-10-CM | POA: Insufficient documentation

## 2015-02-13 DIAGNOSIS — Y9389 Activity, other specified: Secondary | ICD-10-CM | POA: Insufficient documentation

## 2015-02-13 DIAGNOSIS — Y998 Other external cause status: Secondary | ICD-10-CM | POA: Insufficient documentation

## 2015-02-13 DIAGNOSIS — S83005D Unspecified dislocation of left patella, subsequent encounter: Secondary | ICD-10-CM

## 2015-02-13 LAB — I-STAT CHEM 8, ED
BUN: 15 mg/dL (ref 6–20)
CHLORIDE: 105 mmol/L (ref 101–111)
CREATININE: 0.6 mg/dL (ref 0.44–1.00)
Calcium, Ion: 1.21 mmol/L (ref 1.12–1.23)
GLUCOSE: 88 mg/dL (ref 65–99)
HEMATOCRIT: 46 % (ref 36.0–46.0)
HEMOGLOBIN: 15.6 g/dL — AB (ref 12.0–15.0)
Potassium: 3.8 mmol/L (ref 3.5–5.1)
Sodium: 140 mmol/L (ref 135–145)
TCO2: 18 mmol/L (ref 0–100)

## 2015-02-13 LAB — I-STAT BETA HCG BLOOD, ED (MC, WL, AP ONLY): I-stat hCG, quantitative: <5 m[IU]/mL (ref <5)

## 2015-02-13 MED ORDER — IBUPROFEN 600 MG PO TABS: 600.0000 mg | ORAL_TABLET | Freq: Four times a day (QID) | ORAL | Status: AC | PRN

## 2015-02-13 MED ORDER — TRAMADOL HCL 50 MG PO TABS: 100.0000 mg | ORAL_TABLET | Freq: Four times a day (QID) | ORAL | Status: AC | PRN

## 2015-02-13 MED ORDER — HYDROCODONE-ACETAMINOPHEN 5-325 MG PO TABS
1.0000 | ORAL_TABLET | Freq: Once | ORAL | Status: AC
  Administered 2015-02-13: 1 via ORAL
  Filled 2015-02-13: qty 1

## 2015-02-13 MED ORDER — SODIUM CHLORIDE 0.9 % IV BOLUS (SEPSIS)
1000.0000 mL | Freq: Once | INTRAVENOUS | Status: AC
  Administered 2015-02-13: 1000 mL via INTRAVENOUS

## 2015-02-13 NOTE — ED Provider Notes (Signed)
Care assumed from Dr. Rhunette CroftNanavati.  Awaiting Ortho rec's, possible CT angio.    I re-evaluated pt.  I do not think she dislocated her knee.  I think she dislocated her patella.  It occurred when she was lifting a heavy object.  No direct trauma to knee.  Her description of what she saw is consistent with her patella dislocating medially and then relocating.  On exam, her knee is stable without any laxity.  2+ DP pulses distally.   I don't think she needs a CT angiogram.  DC'd with ortho follow up.   Clinical Impression: 1. Knee cap dislocation, left, subsequent encounter   2. Knee sprain and strain, left, initial encounter       Blake DivineJohn Robie Oats, MD 02/14/15 [phone]

## 2015-02-13 NOTE — ED Notes (Signed)
Pt states she is having left knee pain  Pt states she was putting a bed together yesterday when she did something to her knee  Pt states she took some ibuprofen last night before bed and woke up this morning with pain  Pt states she has hx of dislocation to that knee in the past

## 2015-02-13 NOTE — Discharge Instructions (Signed)
We suspect that you have ligament injury to your knee. Please use crutches and do not bear weight the next 5 days, WITH SOME RANGE OF MOTION EXERCISES. Please see the Orthopedic doctor in 1-2 weeks. USE RICE TREATMENT as indicated below, and ice at least 3-4 times a day for 10 minutes.  Medial Collateral Knee Ligament Sprain  with Phase I Rehab The medial collateral ligament (MCL) of the knee helps hold the knee joint in proper alignment and prevents the bones from shifting out of alignment (displacing) to the inside (medially). Injury to the knee may cause a tear in the MCL ligament (sprain). Sprains may heal without treatment, but this often results in a loose joint. Sprains are classified into three categories. Grade 1 sprains cause pain, but the tendon is not lengthened. Grade 2 sprains include a lengthened ligament, due to the ligament being stretched or partially ruptured. With grade 2 sprains, there is still function, although possibly decreased. Grade 3 sprains involve a complete tear of the tendon or muscle, and function is usually impaired. SYMPTOMS   Pain and tenderness on the inner side of the knee.  A "pop," tearing or pulling sensation at the time of injury.  Bruising (contusion) at the site of injury, within 48 hours of injury.  Knee stiffness.  Limping, often walking with the knee bent. CAUSES  An MCL sprain occurs when a force is placed on the ligament that is greater than it can handle. Common mechanisms of injury include:  Direct hit (trauma) to the outer side of the knee, especially if the foot is planted on the ground.  Forceful pivoting of the body and leg, while the foot is planted on the ground. RISK INCREASES WITH:  Contact sports (football, rugby).  Sports that require pivoting or cutting (soccer).  Poor knee strength and flexibility.  Improper equipment use. PREVENTION  Warm up and stretch properly before activity.  Maintain physical  fitness:  Strength, flexibility and endurance.  Cardiovascular fitness.  Wear properly fitted protective equipment (correct length of cleats for surface).  Functional braces may be effective in preventing injury. PROGNOSIS  MCL tears usually heal without the need for surgery. Sometimes however, surgery is required. RELATED COMPLICATIONS  Frequently recurring symptoms, such as the knee giving way, knee instability or knee swelling.  Injury to other structures in the knee joint:  Meniscal cartilage, resulting in locking and swelling of the knee.  Articular cartilage, resulting in knee arthritis.  Other ligaments of the knee.  Injury to nerves, resulting in numbness of the outer leg, foot or ankle and weakness or paralysis, with inability to raise the ankle or toes.  Knee stiffness. TREATMENT Treatment first involves the use of ice and medicine, to reduce pain and inflammation. The use of strengthening and stretching exercises may help reduce pain with activity. These exercises may be performed at home, but referral to a therapist is often advised. You may be advised to walk with crutches until you are able to walk without a limp. Your caregiver may provide you with a hinged knee brace to help regain a full range of motion, while also protecting the injured knee. For severe MCL injuries or injuries that involve other ligaments of the knee, surgery is often advised. MEDICATION  Do not take pain medicine for 7 days before surgery.  Only use over-the-counter pain medicine as directed by your caregiver.  Only use prescription pain relievers as directed and only in needed amounts. HEAT AND COLD  Cold treatment (icing)  should be applied for 10 to 15 minutes every 2 to 3 hours for inflammation and pain, and immediately after any activity, that aggravates the symptoms. Use ice packs or an ice massage.  Heat treatment may be used before performing stretching and strengthening activities  prescribed by your caregiver, physical therapist or athletic trainer. Use a heat pack or warm water soak. SEEK MEDICAL CARE IF:   Symptoms get worse or do not improve in 4 to 6 weeks, despite treatment.  New, unexplained symptoms develop. EXERCISES  PHASE I EXERCISES  RANGE OF MOTION (ROM) AND STRETCHING EXERCISES-Medial Collateral Knee Ligament Sprain Phase I These are some of the initial exercises that your physician, physical therapist or athletic trainer may have you perform to begin your rehabilitation. When you demonstrate gains in your flexibility and strength, your caregiver may progress you to Phase II exercises. As you perform these exercises, remember:  These initial exercises are intended to be gentle. They will help you restore motion without increasing any swelling.  Completing these exercises allows less painful movement and prepares you for the more aggressive strengthening exercises in Phase II.  An effective stretch should be held for at least 30 seconds.  A stretch should never be painful. You should only feel a gentle lengthening or release in the stretched tissue. RANGE OF MOTION-Knee Flexion, Active  Lie on your back with both knees straight. (If this causes back discomfort, bend your healthy knee, placing your foot flat on the floor.)  Slowly slide your heel back toward your buttocks until you feel a gentle stretch in the front of your knee or thigh.  Hold for __________ seconds. Slowly slide your heel back to the starting position. Repeat __________ times. Complete this exercise __________ times per day. STRETCH-Knee Flexion, Supine  Lie on the floor with your right / left heel and foot lightly touching the wall. (Place both feet on the wall if you do not use a door frame.)  Without using any effort, allow gravity to slide your foot down the wall slowly until you feel a gentle stretch in the front of your right / left knee.  Hold this stretch for __________  seconds. Then return the leg to the starting position, using your health leg for help, if needed. Repeat __________ times. Complete this stretch __________ times per day. RANGE OF MOTION-Knee Flexion and Extension, Active-Assisted  Sit on the edge of a table or chair with your thighs firmly supported. It may be helpful to place a folded towel under the end of your right / left thigh.  Flexion (bending): Place the ankle of your healthy leg on top of the other ankle. Use your healthy leg to gently bend your right / left knee until you feel a mild tension across the top of your knee.  Hold for __________ seconds.  Extension (straightening): Switch your ankles so your right / left leg is on top. Use your healthy leg to straighten your right / left knee until you feel a mild tension on the backside of your knee.  Hold for __________ seconds. Repeat __________ times. Complete this exercise __________ times per day. STRETCH-Knee Extension Sitting  Sit with your right / left leg/heel propped on another chair, coffee table, or foot stool.  Allow your leg muscles to relax, letting gravity straighten out your knee.*  You should feel a stretch behind your right / left knee. Hold this position for __________ seconds. Repeat __________ times. Complete this stretch __________ times per day. *Your  physician, physical therapist or athletic trainer may instruct you to place a __________ weight on your thigh, just above your kneecap, to deepen the stretch. STRENGTHENING EXERCISES-Medial Collateral Knee Ligament Sprain Phase I These exercises may help you when beginning to rehabilitate your injury. They may resolve your symptoms with or without further involvement from your physician, physical therapist or athletic trainer. While completing these exercises, remember:   In order to return to more demanding activities, you will likely need to progress to more challenging exercises. Your physician, physical  therapist or athletic trainer will advance your exercises when your tissues show adequate healing and your muscles demonstrate increased strength.  Muscles can gain both the endurance and the strength needed for everyday activities through controlled exercises.  Complete these exercises as instructed by your physician, physical therapist or athletic trainer. Increase the resistance and repetitions only as guided by your caregiver. STRENGTH-Quadriceps, Isometrics  Lie on your back with your right / left leg extended and your opposite knee bent.  Gradually tense the muscles in the front of your right / left thigh. You should see either your kneecap slide up toward your hip or an increased dimpling just above the knee. This motion will push the back of the knee down toward the floor, mat or bed on which you are lying.  Hold the muscle as tight as you can without increasing your pain for __________ seconds.  Relax the muscles slowly and completely in between each repetition. Repeat __________ times. Complete this exercise __________ times per day. STRENGTH-Quadriceps, Short Arcs  Lie on your back. Place a __________ inch towel roll under your knee so that the knee slightly bends.  Raise only your lower leg by tightening the muscles in the front of your thigh. Do not allow your thigh to rise.  Hold this position for __________ seconds. Repeat __________ times. Complete this exercise __________ times per day. OPTIONAL ANKLE WEIGHTS: Begin with ____________________, but DO NOT exceed ____________________. Increase in 1 pound/0.5 kilogram increments.  STRENGTH--Quadriceps, Straight Leg Raises Quality counts! Watch for signs that the quadriceps muscle is working, to be sure you are strengthening the correct muscles and not "cheating" by substituting with healthier muscles.  Lay on your back with your right / left leg extended and your opposite knee bent.  Tense the muscles in the front of your  right / left thigh. You should see either your knee cap slide up or increased dimpling just above the knee. Your thigh may even shake a bit.  Tighten these muscles even more and raise your leg 4 to 6 inches off the floor. Hold for __________ seconds.  Keeping these muscles tense, lower your leg.  Relax the muscles slowly and completely in between each repetition. Repeat __________ times. Complete this exercise __________ times per day. STRENGTH-Hamstring, Isometrics  Lie on your back on a firm surface.  Bend your right / left knee approximately __________ degrees.  Dig your heel into the surface as if you are trying to pull it toward your buttocks. Tighten the muscles in the back of your thighs to "dig" as hard as you can, without increasing any pain.  Hold this position for __________ seconds.  Release the tension gradually and allow your muscle to completely relax for __________ seconds in between each exercise. Repeat __________ times. Complete this exercise __________ times per day. STRENGTH-Hamstring, Curls  Lay on your stomach with your legs extended. (If you lay on a bed, your feet may hang over the edge.)  Tighten the muscles in the back of your thigh to bend your right / left knee up to 90 degrees. Keep your hips flat on the bed.  Hold this position for __________ seconds.  Slowly lower your leg back to the starting position. Repeat __________ times. Complete this exercise __________ times per day. OPTIONAL ANKLE WEIGHTS: Begin with ____________________, but DO NOT exceed ____________________. Increase in 1 pound/0.5 kilogram increments.  Document Released: 09/06/2005 Document Revised: 11/29/2011 Document Reviewed: 12/19/2008 [facility] Patient Information 2015 [place], [place]. This information is not intended to replace advice given to you by your health care provider. Make sure you discuss any questions you have with your health care provider. RICE: Routine Care for  Injuries The routine care of many injuries includes Rest, Ice, Compression, and Elevation (RICE). HOME CARE INSTRUCTIONS  Rest is needed to allow your body to heal. Routine activities can usually be resumed when comfortable. Injured tendons and bones can take up to 6 weeks to heal. Tendons are the cord-like structures that attach muscle to bone.  Ice following an injury helps keep the swelling down and reduces pain.  Put ice in a plastic bag.  Place a towel between your skin and the bag.  Leave the ice on for 15-20 minutes, 3-4 times a day, or as directed by your health care provider. Do this while awake, for the first 24 to 48 hours. After that, continue as directed by your caregiver.  Compression helps keep swelling down. It also gives support and helps with discomfort. If an elastic bandage has been applied, it should be removed and reapplied every 3 to 4 hours. It should not be applied tightly, but firmly enough to keep swelling down. Watch fingers or toes for swelling, bluish discoloration, coldness, numbness, or excessive pain. If any of these problems occur, remove the bandage and reapply loosely. Contact your caregiver if these problems continue.  Elevation helps reduce swelling and decreases pain. With extremities, such as the arms, hands, legs, and feet, the injured area should be placed near or above the level of the heart, if possible. SEEK IMMEDIATE MEDICAL CARE IF:  You have persistent pain and swelling.  You develop redness, numbness, or unexpected weakness.  Your symptoms are getting worse rather than improving after several days. These symptoms may indicate that further evaluation or further X-rays are needed. Sometimes, X-rays may not show a small broken bone (fracture) until 1 week or 10 days later. Make a follow-up appointment with your caregiver. Ask when your X-ray results will be ready. Make sure you get your X-ray results. Document Released: 12/19/2000 Document  Revised: 09/11/2013 Document Reviewed: 02/05/2011 [facility] Patient Information 2015 [place], [place]. This information is not intended to replace advice given to you by your health care provider. Make sure you discuss any questions you have with your health care provider.

## 2015-02-13 NOTE — ED Provider Notes (Signed)
CSN: [id number]     Arrival date & time 02/13/15  0546 History   First MD Initiated Contact with Patient 02/13/15 [phone]     Chief Complaint  Patient presents with  . Knee Pain     (Consider location/radiation/quality/duration/timing/severity/associated sxs/prior Treatment) HPI Comments: Pt comes in with cc of knee pain. Pt reports that while she was moving a bed, her L knee "popped" out and went back in place. She has had increased swelling and pain since then. She thinks she had dislocated the same knee 4 years ago. Denies numbness, tingling. Pt has been limping.  Patient is a 24 y.o. female presenting with knee pain. The history is provided by the patient.  Knee Pain   History reviewed. No pertinent past medical history. History reviewed. No pertinent past surgical history. History reviewed. No pertinent family history. History  Substance Use Topics  . Smoking status: Current Every Day Smoker  . Smokeless tobacco: Not on file  . Alcohol Use: Yes     Comment: occ   OB History    No data available     Review of Systems  Constitutional: Positive for activity change.  Musculoskeletal: Positive for arthralgias.  Skin: Negative for rash.  Hematological: Does not bruise/bleed easily.      Allergies  Review of patient's allergies indicates no known allergies.  Home Medications   Prior to Admission medications   Medication Sig Start Date End Date Taking? Authorizing Provider  acyclovir (ZOVIRAX) 400 MG tablet Take 1 tablet (400 mg total) by mouth 4 (four) times daily. Patient not taking: Reported on 02/13/2015 04/14/14   Arthor CaptainAbigail Harris, PA-C  ibuprofen (ADVIL,MOTRIN) 600 MG tablet Take 1 tablet (600 mg total) by mouth every 6 (six) hours as needed. 02/13/15   Derwood KaplanAnkit Miquel Stacks, MD  oxyCODONE-acetaminophen (PERCOCET) 5-325 MG per tablet Take 1-2 tablets by mouth every 4 (four) hours as needed. Patient not taking: Reported on 02/13/2015 04/14/14   Arthor CaptainAbigail Harris, PA-C  traMADol  (ULTRAM) 50 MG tablet Take 2 tablets (100 mg total) by mouth every 6 (six) hours as needed. 02/13/15   Chamille Werntz Rhunette CroftNanavati, MD   BP 115/66 mmHg  Pulse 64  Temp(Src) 98.2 F (36.8 C) (Oral)  Resp 16  Ht 5\' 3"  (1.6 m)  SpO2 98%  LMP 01/18/2015 (Approximate) Physical Exam  Constitutional: She is oriented to person, place, and time. She appears well-developed.  Eyes: Conjunctivae are normal.  Neck: Neck supple.  Cardiovascular: Normal rate and intact distal pulses.   Pulmonary/Chest: Effort normal.  Musculoskeletal:  L knee: Grossly edematous. Neg anterior and posterior drawers test. Neg valgus or varus instability   Neurological: She is alert and oriented to person, place, and time.  Skin: Skin is warm.  Nursing note and vitals reviewed.   ED Course  Procedures (including critical care time) Labs Review Labs Reviewed  I-STAT CHEM 8, ED - Abnormal; Notable for the following:    Hemoglobin 15.6 (*)    All other components within normal limits  I-STAT BETA HCG BLOOD, ED ([facility], [facility], [facility] ONLY)    Imaging Review Dg Knee Complete 4 Views Left  02/13/2015   CLINICAL DATA:  Twisted left knee.  EXAM: LEFT KNEE - COMPLETE 4+ VIEW  COMPARISON:  None.  FINDINGS: Tiny bony density arising from the medial condyle of the distal femur at the level of the medial collateral ligament insertion consistent with a old injury. No other abnormality identified. No prominent effusion.  IMPRESSION: Tiny bony avulsion at the origin of  the medial collateral ligament of the medial femoral condyle.   Electronically Signed   By: Maisie Fus  Register   On: 02/13/2015 07:12     EKG Interpretation None      MDM   Final diagnoses:  Knee cap dislocation, left, subsequent encounter  Knee sprain and strain, left, initial encounter    Pt comes in with cc of knee pain and swelling. She appears to have dislocated her knee, and spontaneously reduced it. She is neurovascularly intact. Low pretest probability for  Based on  the Xray - possible MCA teat/injury. Will give ortho f.u, RICE, non weight bearing to gradual partial weight bearing advised, with daily ROM exercises.    Derwood Kaplan, MD 02/15/15 [phone]

## 2015-02-13 NOTE — ED Notes (Signed)
Holding off on new orders, Per MD Rhunette CroftNanavati, until MD speaks with ortho.

## 2015-03-10 ENCOUNTER — Other Ambulatory Visit: Payer: Self-pay | Admitting: Orthopedic Surgery

## 2015-03-10 DIAGNOSIS — M25562 Pain in left knee: Secondary | ICD-10-CM

## 2015-03-12 ENCOUNTER — Ambulatory Visit
Admission: RE | Admit: 2015-03-12 | Discharge: 2015-03-12 | Disposition: A | Discharge status: Home / Self Care | Payer: No Typology Code available for payment source | Source: Ambulatory Visit | Attending: Orthopedic Surgery | Admitting: Orthopedic Surgery

## 2015-03-12 DIAGNOSIS — M25562 Pain in left knee: Secondary | ICD-10-CM

## 2016-12-16 ENCOUNTER — Encounter (HOSPITAL_COMMUNITY): Payer: Self-pay | Admitting: *Deleted

## 2016-12-16 ENCOUNTER — Emergency Department (HOSPITAL_COMMUNITY)
Admission: EM | Admit: 2016-12-16 | Discharge: 2016-12-19 | Disposition: E | Payer: Self-pay | Attending: Emergency Medicine | Admitting: Emergency Medicine

## 2016-12-16 DIAGNOSIS — I469 Cardiac arrest, cause unspecified: Secondary | ICD-10-CM | POA: Insufficient documentation

## 2016-12-17 ENCOUNTER — Encounter (HOSPITAL_COMMUNITY): Payer: Self-pay | Admitting: Emergency Medicine

## 2016-12-19 NOTE — ED Notes (Signed)
Patient arrived cpr in progress edp at bedside, 1621 pulse check -no pulse PEA, resume cpr,  1622 epip x 1 per I&O, end tidal co2 17 1624 pulse check no pulse cpr resumed.  1626 epip x 1 intubated with 7.5 24 @ lip bilateral breath sounds,  1628 patient remains in PEA  1629- all ALS efforts unsuccessful, CPR discontinued.

## 2016-12-19 NOTE — ED Provider Notes (Signed)
[facility]-EMERGENCY DEPT Provider Note   CSN: [id number] Arrival date & time: 2017-09-03  1616     History   Chief Complaint Chief Complaint  Patient presents with  . Cardiac Arrest    HPI Katelyn Boyer is a 26 y.o. female    The history is provided by the EMS personnel and a relative. The history is limited by the condition of the patient.  Cardiac Arrest  Witnessed by:  Not witnessed Incident location:  Home Time since incident:  1 hour Time before ALS initiated:  8-10 minutes Condition upon EMS arrival:  Agonal respirations Pulse:  Absent Initial cardiac rhythm per EMS:  PEA Treatments prior to arrival:  ACLS protocol and AED discharged Medications given prior to ED:  Epinephrine (epi x 12, Amiodarone, Narcan) Number of shocks delivered:  4 Rhythm after defibrillation:  PEA Airway: King airway. Rhythm on admission to ED:  PEA   History reviewed. No pertinent past medical history.  There are no active problems to display for this patient.   No past surgical history on file.  OB History    No data available       Home Medications    Prior to Admission medications   Not on File    Family History No family history on file.  Social History Social History  Substance Use Topics  . Smoking status: Not on file  . Smokeless tobacco: Not on file  . Alcohol use Not on file     Allergies   Patient has no allergy information on record.   Review of Systems Review of Systems  Unable to perform ROS: Patient unresponsive     Physical Exam Updated Vital Signs BP (!) 0/0   Pulse (!) 0   Temp 99.7 F (37.6 C) (Rectal)   Resp (!) 0   Ht 5\' 5"  (1.651 m)   Wt 190 lb (86.2 kg)   BMI 31.62 kg/m   Physical Exam  Constitutional: She appears distressed.  No signs of external trauma.   Eyes: Conjunctivae are normal. Right pupil is not reactive. Right pupil is round. Left pupil is not reactive. Left pupil is round.  3mm and fixed bilaterally   Neck:  King  airway in place  Cardiovascular:  No murmur heard. Pt in cardiac arrest with PEA on arrival. No pulses.    Pulmonary/Chest: No stridor. She is in respiratory distress. She exhibits no tenderness.  Abdominal: She exhibits no distension.  Musculoskeletal: She exhibits no tenderness.  Neurological: She is unresponsive. GCS eye subscore is 1. GCS verbal subscore is 1. GCS motor subscore is 1.  Skin: Capillary refill takes more than 3 seconds. No rash noted.  Nursing note and vitals reviewed.    ED Treatments / Results  Labs (all labs ordered are listed, but only abnormal results are displayed) Labs Reviewed - No data to display  EKG  EKG Interpretation None       Radiology No results found.  Procedures Procedure Name: Intubation Date/Time: 12/17/2016 12:32 AM Performed by: Heide ScalesEGELER, Erlin Gardella J Pre-anesthesia Checklist: Patient identified Preoxygenation: Pre-oxygenation with 100% oxygen Laryngoscope Size: Glidescope Grade View: Grade I Tube size: 7.0 mm Number of attempts: 1 Placement Confirmation: ETT inserted through vocal cords under direct vision,  Breath sounds checked- equal and bilateral,  Positive ETCO2 and CO2 detector Secured at: 24 cm Tube secured with: ETT holder Dental Injury: Teeth and Oropharynx as per pre-operative assessment       (including critical care time)  CRITICAL CARE  Performed by: Canary Brim Rella Egelston Total critical care time: 35 minutes Critical care time was exclusive of separately billable procedures and treating other patients. Critical care was necessary to treat or prevent imminent or life-threatening deterioration. Critical care was time spent personally by me on the following activities: development of treatment plan with patient and/or surrogate as well as nursing, discussions with consultants, evaluation of patient's response to treatment, examination of patient, obtaining history from patient or surrogate, ordering and performing  treatments and interventions, ordering and review of laboratory studies, ordering and review of radiographic studies, pulse oximetry and re-evaluation of patient's condition.  Cardiopulmonary Resuscitation (CPR) Procedure Note Directed/Performed by: Canary Brim Ramy Greth I personally directed ancillary staff and/or performed CPR in an effort to regain return of spontaneous circulation and to maintain cardiac, neuro and systemic perfusion.     Medications Ordered in ED Medications - No data to display Epi x 2  Initial Impression / Assessment and Plan / ED Course  I have reviewed the triage vital signs and the nursing notes.  Pertinent labs & imaging results that were available during my care of the patient were reviewed by me and considered in my medical decision making (see chart for details).     Katelyn Boyer is a 26 y.o. female with no significant past medical history aside from family report of cold like symptoms over the last 2 days with sore throat who presents for cardiac arrest. Patient is brought in by EMS. According to EMS, patient has had a URI-like cold for the last 2 days. Patient was at home with a roommate when the roommate left. The roommate then returned several minutes later and patient was unresponsive with agonal respirations. No report of drug use, eating, or ingestions. EMS arrived and CPR was initiated. After approximately 40 minutes of CPR, EMS called and were instructed to have the patient brought to the ED.  In total, EMS reports that she has had PEA arrest as well as for shockable rhythms. Patient received 4 shocks without return of spontaneous circulation. Patient received 12 doses of epinephrine, amiodarone, and Narcan without improvement. A King airway was placed by EMS prior to transport.  On arrival, patient had no intact reflexes or meaningful movement. Patient had no evidence of external trauma. Patient had mechanical CPR machine in progress on arrival.  Patient's pupils were non-reactive on arrival.  Patient arrived in PEA arrest. Airway was secured with removal of King and placement of endotracheal tube as described above.  After securing airway, breath sounds were equal bilaterally. Patient had no improvement in cardiac arrest.  2 doses of epinephrine were given during treatment in the ED without improvement.  After over one hour of total CPR, multiple defibrillations, multiple medications, and no clinical improvement, patient's death was declared at 16:29.   Given the unknown cause of death and patient's young age with no medical problems aside from recent URI, patient will be an ME case. Medical examiner was called and they will see the patient. Given recent URI, myocarditis considered as cause. Laboratory testing was not obtained due to continuing CPR on arrival.  Patient's mother and extended family were notified of the death. They were at the bedside shortly after time of death.     Final Clinical Impressions(s) / ED Diagnoses   Final diagnoses:  Cardiac arrest (HCC)     Clinical Impression: 1. Cardiac arrest [facility])     Disposition: Deceased. Time of Death 16:29.     Canary Brim Niobe Dick,  MD 12/17/16 2130

## 2016-12-19 NOTE — Progress Notes (Signed)
   18-Jan-2017 1700  Clinical Encounter Type  Visited With Patient and family together;Health care provider  Visit Type Initial;Psychological support;Spiritual support;Social support;Death;ED  Referral From Nurse;Physician  Consult/Referral To Chaplain  Spiritual Encounters  Spiritual Needs Sacred text;Brochure;Prayer;Emotional;Grief support  Stress Factors  Patient Stress Factors None identified  Family Stress Factors Health changes;Lack of knowledge;Loss    Chaplain responded to post cpr/death. Family experiencing emotional extraordinary sense of loss and extremely emotional. Large extended family, totally appropriate for age of only child, department director threatened to call security due to volume of cries in hallway. Chaplain de-escalated but still feels entirely appropriate for situation. Chaplain prayed with family and provided emotional support. Deborahann Poteat L. Yue Glasheen, MDiv.

## 2016-12-19 NOTE — ED Notes (Signed)
Patient presents to ed via [facility] CPR in progress per ems room-mate had been gone from the house approx. 10 mins returned and found patient unresponsive on the bed. Ems was called upon their arrival patient was pulseless and apneic CPR was started approx. 1502. Patient was given 12 epip, 2mg  narcan,   Shocked x 4  Patient was in PEA, king airway placed, amiodarone 450 mg , IO placed in left tib/fib. All per ems.

## 2016-12-19 DEATH — deceased

## 2017-05-19 IMAGING — MR MR KNEE*L* W/O CM
4 of 6 series · 19 of 40 positions shown · non-contrast
Comparison: Plain films left knee 02/13/2015.

CLINICAL DATA: Twisting injury left knee 02/12/2015. Continued
pain. Subsequent encounter.

EXAM:
MRI OF THE LEFT KNEE WITHOUT CONTRAST
TECHNIQUE: Multiplanar, multisequence MR imaging of the knee was performed. No
intravenous contrast was administered.

[Series 3: PD fat-sat · axial · 4.0mm · 0.29mm/px · z∈[-65,+60]mm · 8 of 26 slices shown (1 of 4)]
[im 1/26]
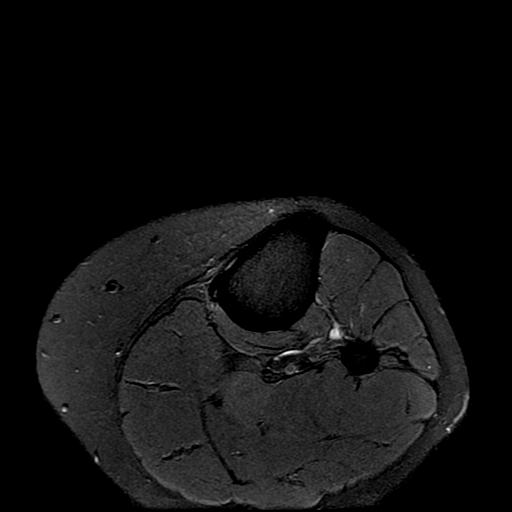
[im 4/26]
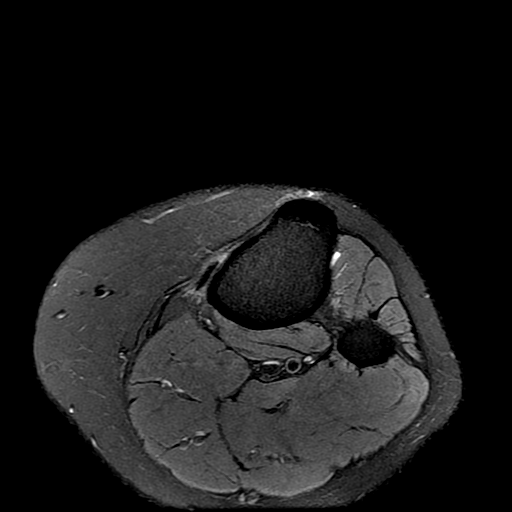
[im 8/26]
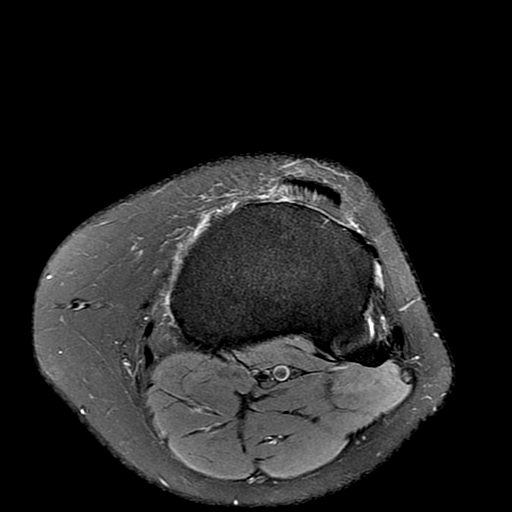
[im 11/26]
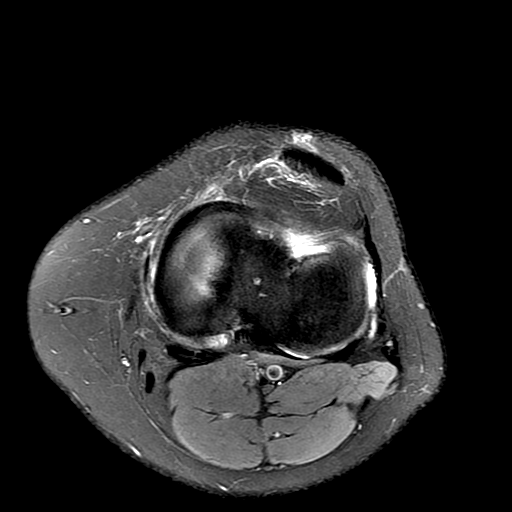
[im 15/26]
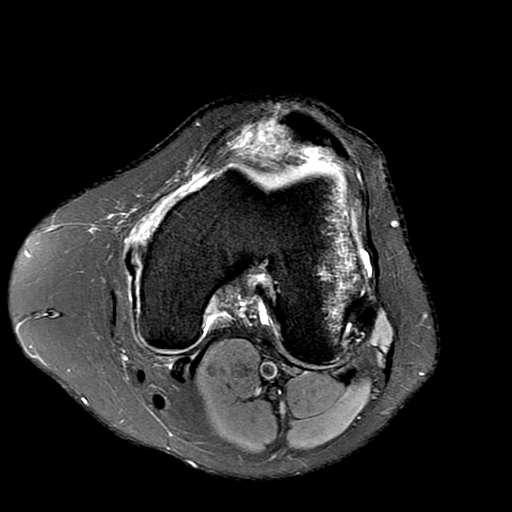
[im 18/26]
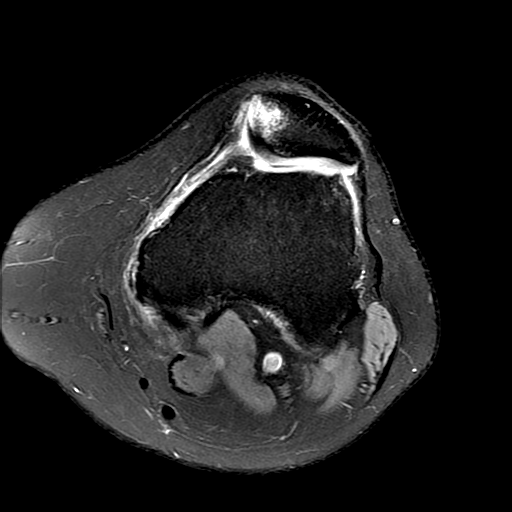
[im 22/26]
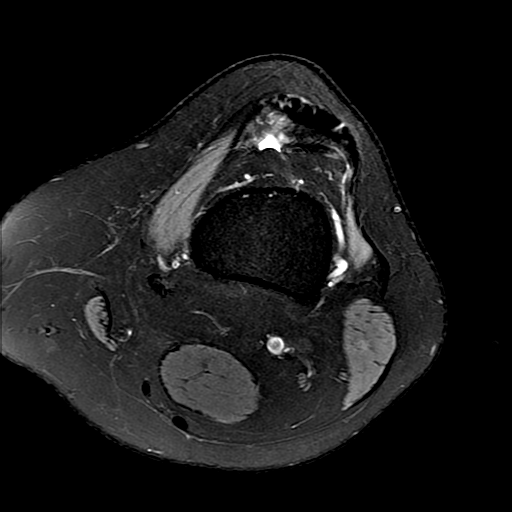
[im 26/26]
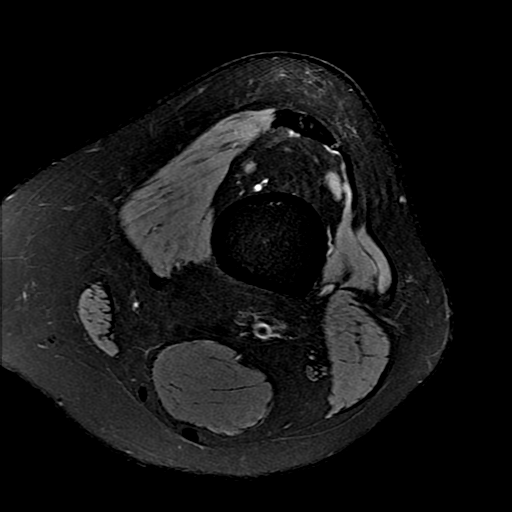

[Series 4: PD fat-sat · sagittal · 3.0mm · 0.29mm/px · 5 of 24 slices shown (2 of 4)]
[im 1/24]
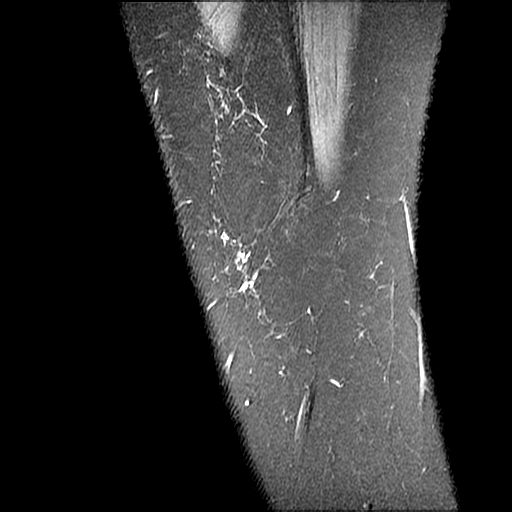
[im 4/24]
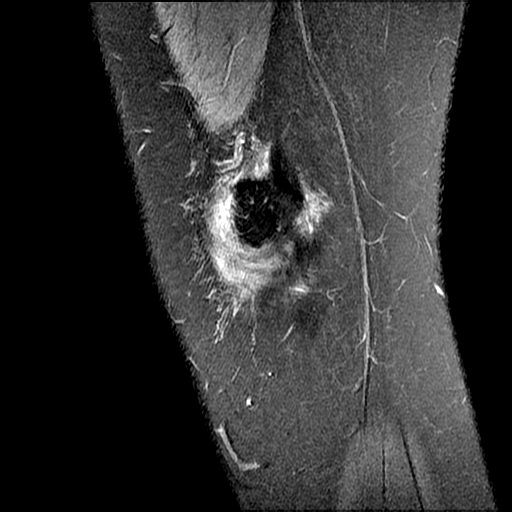
[im 8/24]
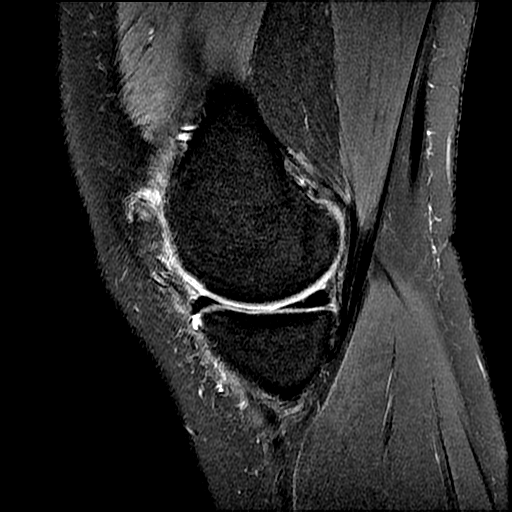
[im 12/24]
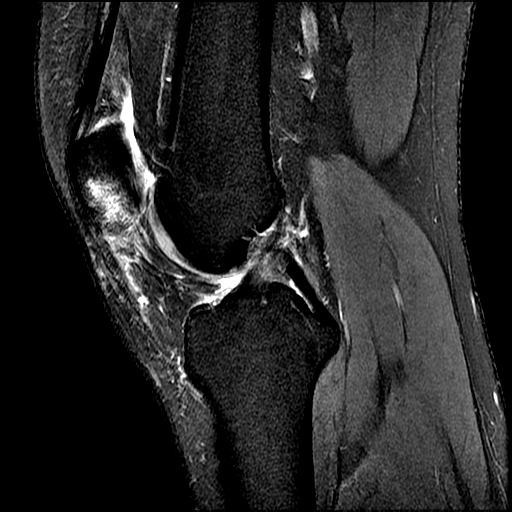
[im 20/24]
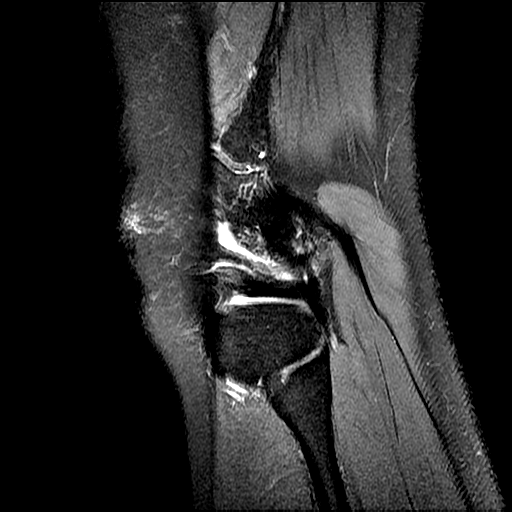

[Series 7: PD fat-sat · coronal · 4.0mm · 0.31mm/px · 3 of 26 slices shown (3 of 4)]
[im 5/26]
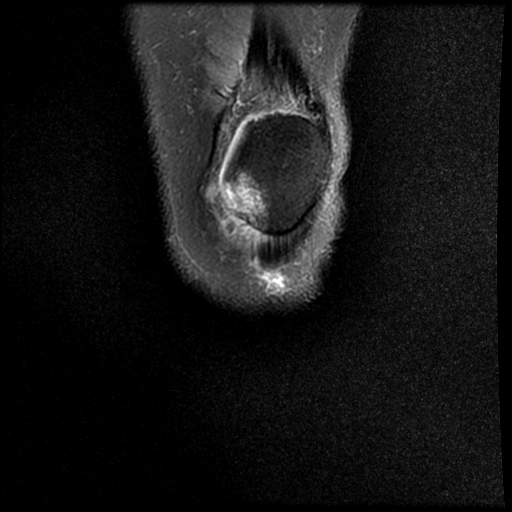
[im 13/26]
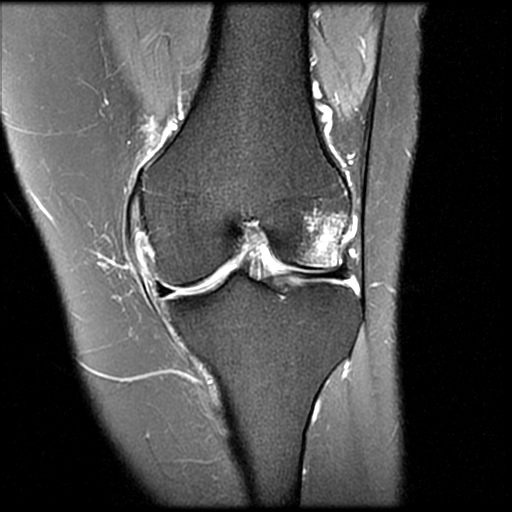
[im 21/26]
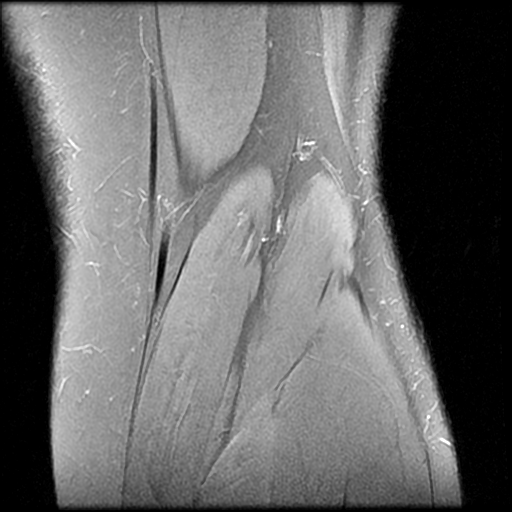

[Series 9: PD fat-sat · coronal · 2.0mm · 0.31mm/px · 3 of 13 slices shown (4 of 4)]
[im 1/13]
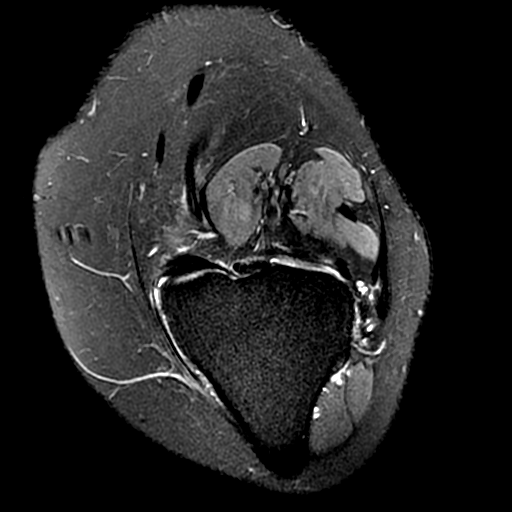
[im 9/13]
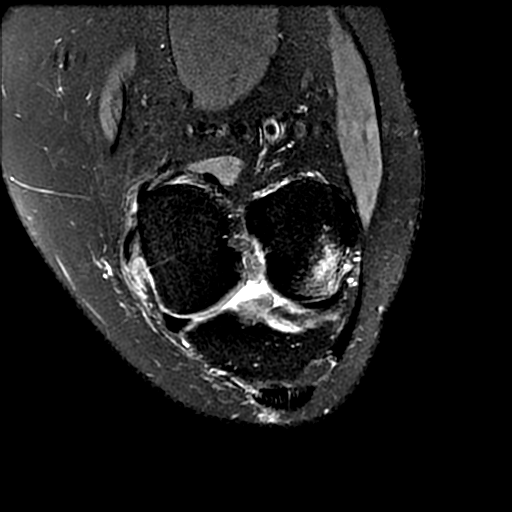
[im 13/13]
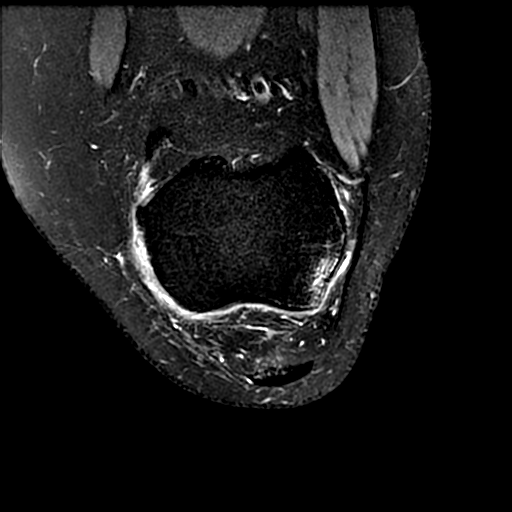

[19 of 40 positions shown; findings below may reference images not displayed]

FINDINGS: MENISCI

Medial meniscus:  Intact.

Lateral meniscus:  Intact.

LIGAMENTS

Cruciates:  Intact.

Collaterals:  Intact.

CARTILAGE

Patellofemoral:  Intact.

Medial:  Intact.

Lateral:  Intact.

Joint:  A very small joint effusion is identified.

Popliteal Fossa:  Unremarkable.

Extensor Mechanism: The patient status post transient lateral
dislocation of the patella. Marrow edema is seen in the lateral
femoral condyle and in the medial patellar facet consistent with
contusion. No discrete fracture is identified. The vastus medialis
is unremarkable. There is increased T2 signal in the medial
patellofemoral ligament at its attachment to the patella consistent
with sprain but intact fibers are identified. The quadriceps and
patellar tendons appear normal. The patient has a markedly
diminutive medial patellar facet and shallow femoral trochlea. The
tibial tubercle-trochlear groove distance is normal at 1.4 cm.

Bones:  As described above.  Otherwise unremarkable.
IMPRESSION: This study is positive for transient lateral dislocation of the
patella with associated bone contusions in the medial patellar facet
and lateral femoral condyle. Edema in the MPFL at its attachment to
the patella is consistent with sprain but intact fibers of are
identified. Diminutive medial patellar facet and shallow trochlear
groove predispose to dislocation. The tibial tubercle-trochlear
groove distance is normal.

Negative for meniscal or cruciate ligament tear.
